# Patient Record
Sex: Female | Born: 1995 | Race: Black or African American | Marital: Single | Smoking: Never smoker
Health system: Southern US, Community
[De-identification: ages and names within clinical notes are randomized; demographics above are authoritative.]

---
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
## 2014-02-10 ENCOUNTER — Ambulatory Visit (INDEPENDENT_AMBULATORY_CARE_PROVIDER_SITE_OTHER): Payer: Self-pay | Admitting: Family Medicine

## 2014-02-10 ENCOUNTER — Encounter: Payer: Self-pay | Admitting: Family Medicine

## 2014-02-10 VITALS — BP 105/71 | HR 79 | Ht 64.0 in | Wt 210.2 lb

## 2014-02-10 DIAGNOSIS — Z025 Encounter for examination for participation in sport: Secondary | ICD-10-CM

## 2014-02-10 DIAGNOSIS — Z0289 Encounter for other administrative examinations: Secondary | ICD-10-CM

## 2014-02-10 NOTE — Assessment & Plan Note (Signed)
Cleared for all sports without restrictions. 

## 2014-02-10 NOTE — Progress Notes (Signed)
Patient ID: Kathryn Obrien, female   DOB: April 01, 1996, 18 y.o.   MRN: [id number]  Patient is a 18 y.o. year old female here for sports physical.  Patient plans to dance.  Reports no current complaints.  Denies chest pain, shortness of breath, passing out with exercise.  No medical problems.  No family history of heart disease or sudden death before age 19.   Vision 20/50 right, 20/50 left - has glasses but not wearing them Blood pressure normal for age and height History of broken forearm when 18 years old, broken left fibula when 89 - no surgeries, no issues currently - healed without a problem.  History reviewed. No pertinent past medical history.  No current outpatient prescriptions on file prior to visit.   No current facility-administered medications on file prior to visit.    History reviewed. No pertinent past surgical history.  No Known Allergies  History   Social History  . Marital Status: Single    Spouse Name: N/A    Number of Children: N/A  . Years of Education: N/A   Occupational History  . Not on file.   Social History Main Topics  . Smoking status: Never Smoker   . Smokeless tobacco: Not on file  . Alcohol Use: Not on file  . Drug Use: Not on file  . Sexual Activity: Not on file   Other Topics Concern  . Not on file   Social History Narrative  . No narrative on file    Family History  Problem Relation Age of Onset  . Sudden death Neg Hx   . Heart attack Neg Hx     BP 105/71  Pulse 79  Ht 5\' 4"  (1.626 m)  Wt 210 lb 3.2 oz (95.346 kg)  BMI 36.06 kg/m2  Review of Systems: See HPI above.  Physical Exam: Gen: NAD CV: RRR no MRG Lungs: CTAB MSK: FROM and strength all joints and muscle groups.  No evidence scoliosis.  Assessment/Plan: 1. Sports physical: Cleared for all sports without restrictions.

## 2020-06-01 DIAGNOSIS — R300 Dysuria: Secondary | ICD-10-CM | POA: Diagnosis not present

## 2020-06-01 DIAGNOSIS — N390 Urinary tract infection, site not specified: Secondary | ICD-10-CM | POA: Diagnosis not present

## 2020-08-18 DIAGNOSIS — Z124 Encounter for screening for malignant neoplasm of cervix: Secondary | ICD-10-CM | POA: Diagnosis not present

## 2020-08-18 DIAGNOSIS — Z113 Encounter for screening for infections with a predominantly sexual mode of transmission: Secondary | ICD-10-CM | POA: Diagnosis not present

## 2020-08-18 DIAGNOSIS — Z01419 Encounter for gynecological examination (general) (routine) without abnormal findings: Secondary | ICD-10-CM | POA: Diagnosis not present

## 2020-09-02 DIAGNOSIS — Z23 Encounter for immunization: Secondary | ICD-10-CM | POA: Diagnosis not present

## 2020-09-02 DIAGNOSIS — R8271 Bacteriuria: Secondary | ICD-10-CM | POA: Diagnosis not present

## 2020-09-02 DIAGNOSIS — Z0001 Encounter for general adult medical examination with abnormal findings: Secondary | ICD-10-CM | POA: Diagnosis not present

## 2020-09-02 DIAGNOSIS — R010 Benign and innocent cardiac murmurs: Secondary | ICD-10-CM | POA: Diagnosis not present

## 2020-09-02 DIAGNOSIS — E669 Obesity, unspecified: Secondary | ICD-10-CM | POA: Diagnosis not present

## 2020-09-02 DIAGNOSIS — Z136 Encounter for screening for cardiovascular disorders: Secondary | ICD-10-CM | POA: Diagnosis not present

## 2020-09-02 DIAGNOSIS — Z124 Encounter for screening for malignant neoplasm of cervix: Secondary | ICD-10-CM | POA: Diagnosis not present

## 2020-09-08 DIAGNOSIS — A749 Chlamydial infection, unspecified: Secondary | ICD-10-CM | POA: Diagnosis not present

## 2020-09-08 DIAGNOSIS — Z113 Encounter for screening for infections with a predominantly sexual mode of transmission: Secondary | ICD-10-CM | POA: Diagnosis not present

## 2020-09-09 DIAGNOSIS — Z1159 Encounter for screening for other viral diseases: Secondary | ICD-10-CM | POA: Diagnosis not present

## 2020-09-09 DIAGNOSIS — R945 Abnormal results of liver function studies: Secondary | ICD-10-CM | POA: Diagnosis not present

## 2020-09-09 DIAGNOSIS — Z136 Encounter for screening for cardiovascular disorders: Secondary | ICD-10-CM | POA: Diagnosis not present

## 2020-09-09 DIAGNOSIS — E669 Obesity, unspecified: Secondary | ICD-10-CM | POA: Diagnosis not present

## 2020-09-12 ENCOUNTER — Other Ambulatory Visit: Payer: Self-pay | Admitting: Internal Medicine

## 2020-09-12 DIAGNOSIS — R945 Abnormal results of liver function studies: Secondary | ICD-10-CM

## 2020-09-23 ENCOUNTER — Ambulatory Visit
Admission: RE | Admit: 2020-09-23 | Discharge: 2020-09-23 | Disposition: A | Discharge status: Home / Self Care | Payer: Federal, State, Local not specified - PPO | Source: Ambulatory Visit | Attending: Internal Medicine | Admitting: Internal Medicine

## 2020-09-23 DIAGNOSIS — R945 Abnormal results of liver function studies: Secondary | ICD-10-CM

## 2020-09-23 DIAGNOSIS — K760 Fatty (change of) liver, not elsewhere classified: Secondary | ICD-10-CM | POA: Diagnosis not present

## 2020-10-07 DIAGNOSIS — A64 Unspecified sexually transmitted disease: Secondary | ICD-10-CM | POA: Diagnosis not present

## 2020-10-07 DIAGNOSIS — Z113 Encounter for screening for infections with a predominantly sexual mode of transmission: Secondary | ICD-10-CM | POA: Diagnosis not present

## 2020-10-07 DIAGNOSIS — Z8619 Personal history of other infectious and parasitic diseases: Secondary | ICD-10-CM | POA: Diagnosis not present

## 2020-10-28 DIAGNOSIS — R945 Abnormal results of liver function studies: Secondary | ICD-10-CM | POA: Diagnosis not present

## 2020-10-28 DIAGNOSIS — Z1159 Encounter for screening for other viral diseases: Secondary | ICD-10-CM | POA: Diagnosis not present

## 2020-11-03 DIAGNOSIS — E669 Obesity, unspecified: Secondary | ICD-10-CM | POA: Diagnosis not present

## 2020-11-03 DIAGNOSIS — E785 Hyperlipidemia, unspecified: Secondary | ICD-10-CM | POA: Diagnosis not present

## 2020-11-03 DIAGNOSIS — R945 Abnormal results of liver function studies: Secondary | ICD-10-CM | POA: Diagnosis not present

## 2020-11-03 DIAGNOSIS — K760 Fatty (change of) liver, not elsewhere classified: Secondary | ICD-10-CM | POA: Diagnosis not present

## 2021-01-11 DIAGNOSIS — Z03818 Encounter for observation for suspected exposure to other biological agents ruled out: Secondary | ICD-10-CM | POA: Diagnosis not present

## 2021-01-11 DIAGNOSIS — Z20822 Contact with and (suspected) exposure to covid-19: Secondary | ICD-10-CM | POA: Diagnosis not present

## 2021-11-15 DIAGNOSIS — Z1159 Encounter for screening for other viral diseases: Secondary | ICD-10-CM | POA: Diagnosis not present

## 2021-11-15 DIAGNOSIS — Z118 Encounter for screening for other infectious and parasitic diseases: Secondary | ICD-10-CM | POA: Diagnosis not present

## 2021-11-15 DIAGNOSIS — Z114 Encounter for screening for human immunodeficiency virus [HIV]: Secondary | ICD-10-CM | POA: Diagnosis not present

## 2021-11-15 DIAGNOSIS — Z01419 Encounter for gynecological examination (general) (routine) without abnormal findings: Secondary | ICD-10-CM | POA: Diagnosis not present

## 2021-11-15 DIAGNOSIS — Z113 Encounter for screening for infections with a predominantly sexual mode of transmission: Secondary | ICD-10-CM | POA: Diagnosis not present

## 2022-08-05 IMAGING — US US ABDOMEN LIMITED RUQ/ASCITES
1 series · 14 of 25 positions shown · non-contrast
Comparison: None.

CLINICAL DATA: Abnormal liver enzymes

EXAM:
ULTRASOUND ABDOMEN LIMITED RIGHT UPPER QUADRANT

[Series 1: us abdomen limited ruq/ascites · 0.28mm/px · 14 of 32 slices shown]
[im 1/32]
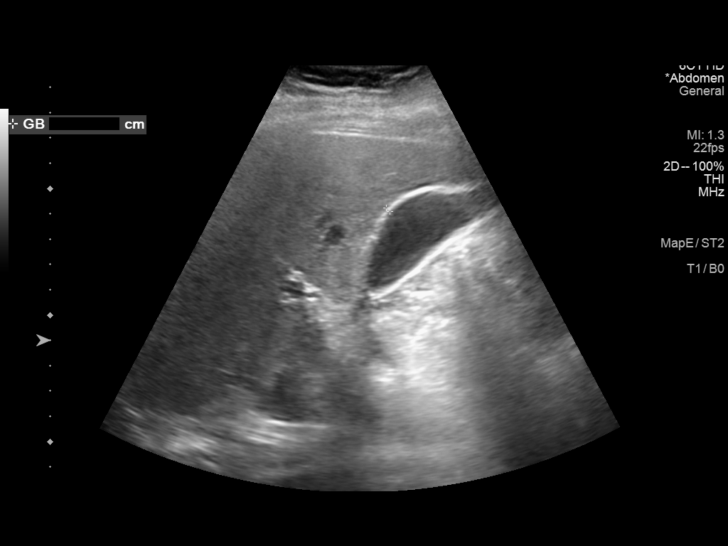
[im 3/32]
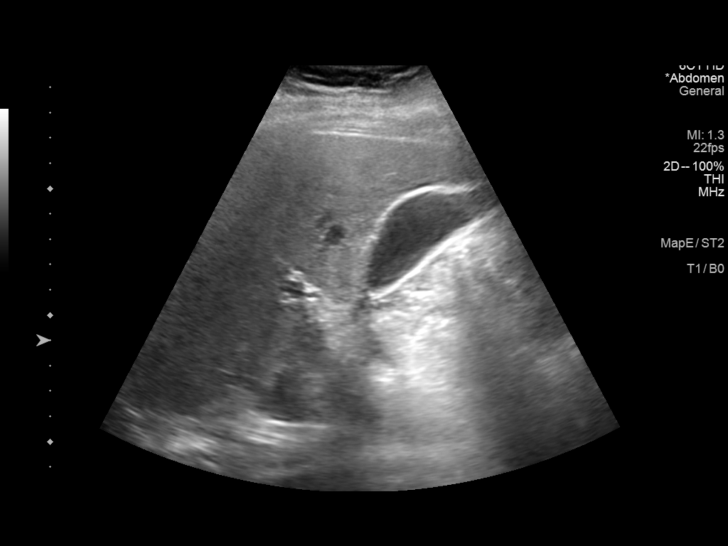
[im 6/32]
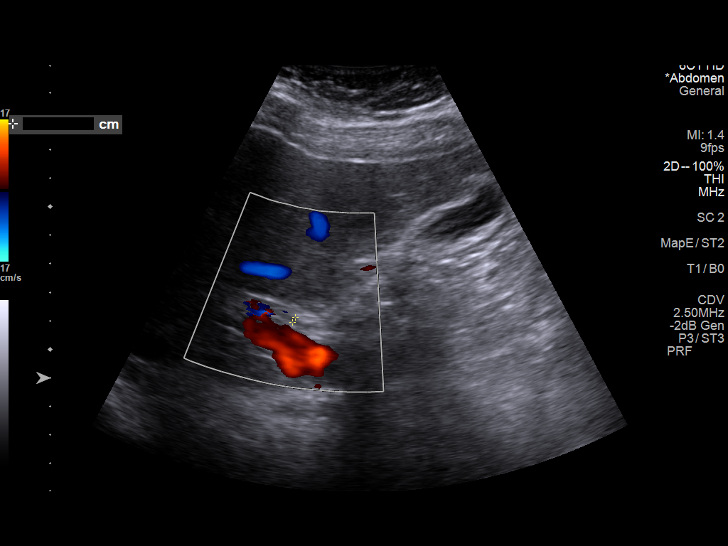
[im 8/32]
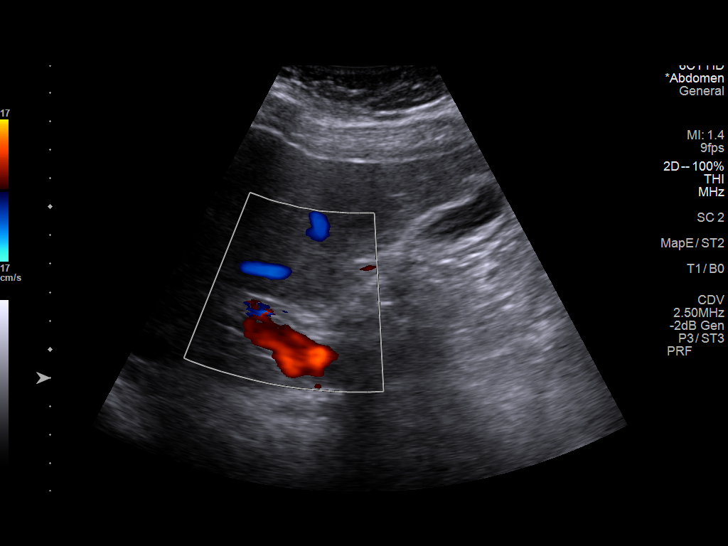
[im 11/32]
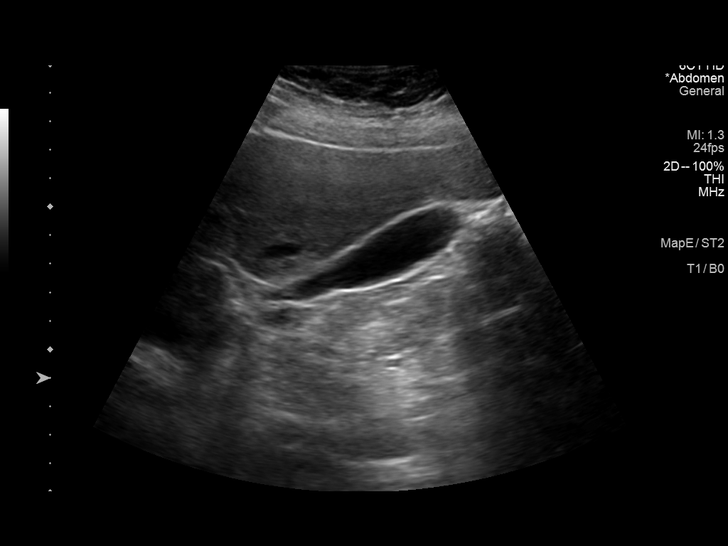
[im 12/32]
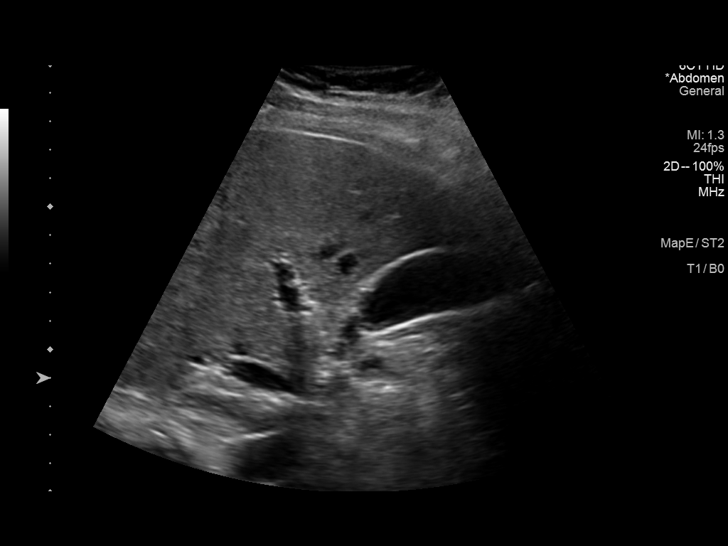
[im 15/32]
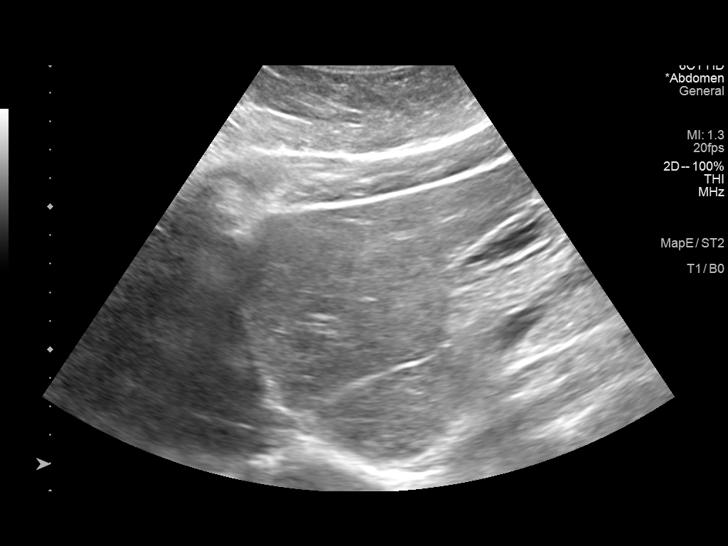
[im 17/32]
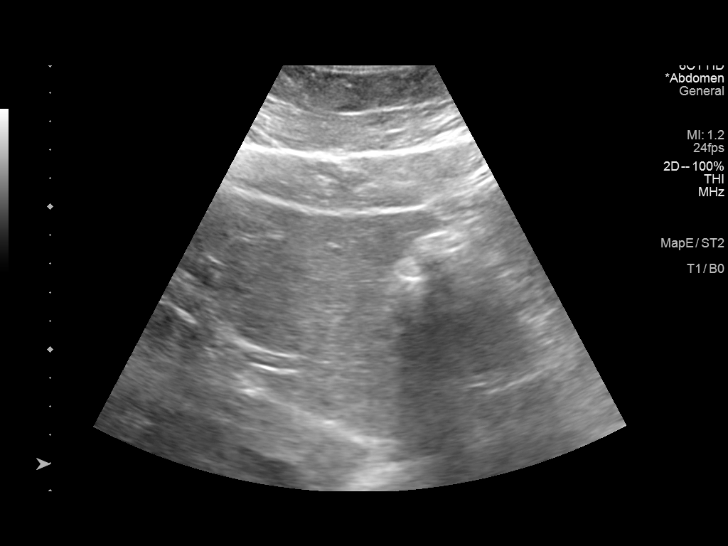
[im 20/32]
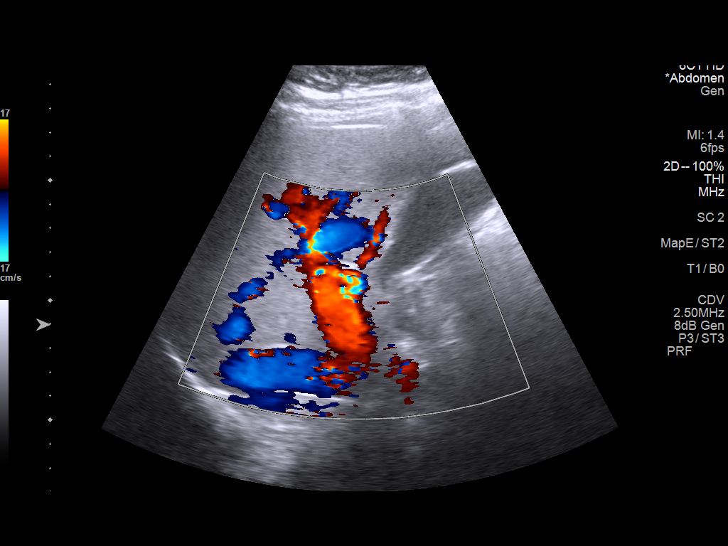
[im 21/32]
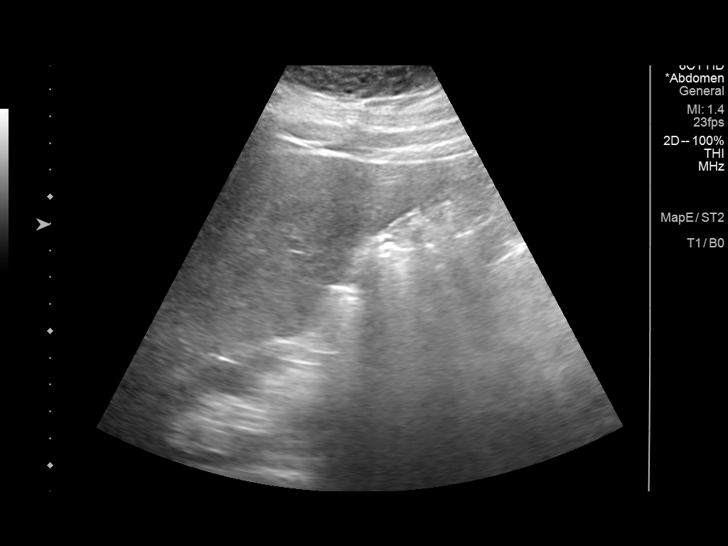
[im 24/32]
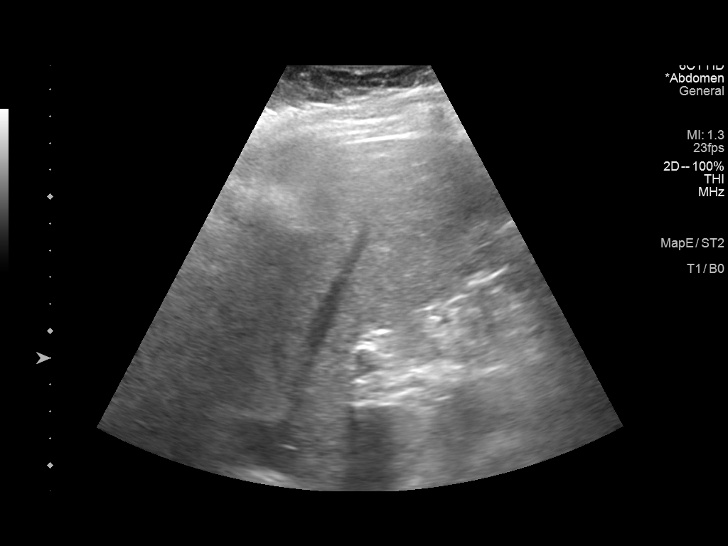
[im 26/32]
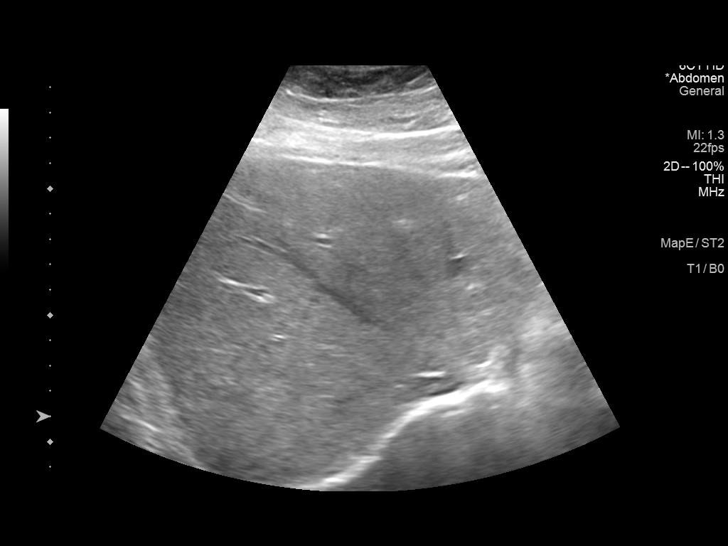
[im 29/32]
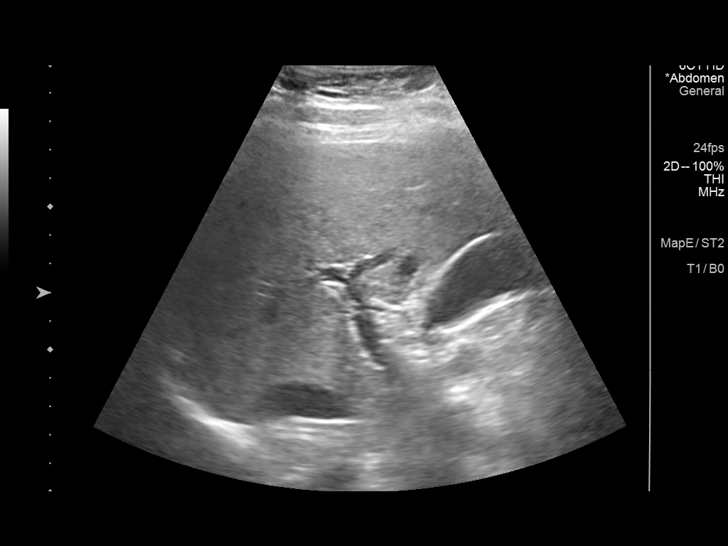
[im 32/32]
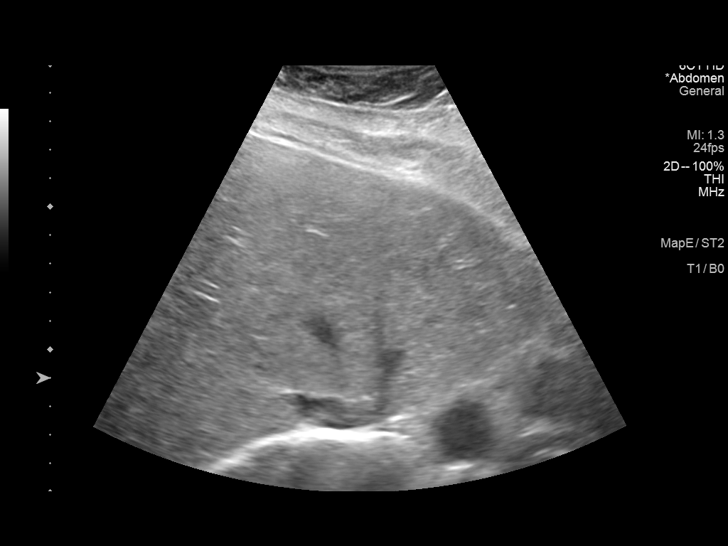

[14 of 25 positions shown; findings below may reference images not displayed]

FINDINGS: Gallbladder:

No gallstones or wall thickening visualized. No sonographic Murphy
sign noted by sonographer.

Common bile duct:

Diameter: 0.2 cm, within normal limits

Liver:

No focal lesion identified. Parenchymal echogenicity is diffusely
increased. Portal vein is patent on color Doppler imaging with
normal direction of blood flow towards the liver.

Other: None.
IMPRESSION: Diffusely increased liver parenchymal echogenicity which is
nonspecific but most commonly seen with hepatic steatosis.

## 2022-09-04 DIAGNOSIS — R300 Dysuria: Secondary | ICD-10-CM | POA: Diagnosis not present

## 2022-09-04 DIAGNOSIS — Z7251 High risk heterosexual behavior: Secondary | ICD-10-CM | POA: Diagnosis not present

## 2022-11-21 DIAGNOSIS — Z01419 Encounter for gynecological examination (general) (routine) without abnormal findings: Secondary | ICD-10-CM | POA: Diagnosis not present

## 2022-11-21 DIAGNOSIS — Z113 Encounter for screening for infections with a predominantly sexual mode of transmission: Secondary | ICD-10-CM | POA: Diagnosis not present

## 2024-08-03 ENCOUNTER — Other Ambulatory Visit: Payer: Self-pay

## 2024-08-03 ENCOUNTER — Encounter (HOSPITAL_BASED_OUTPATIENT_CLINIC_OR_DEPARTMENT_OTHER): Payer: Self-pay

## 2024-08-03 ENCOUNTER — Emergency Department (HOSPITAL_BASED_OUTPATIENT_CLINIC_OR_DEPARTMENT_OTHER)

## 2024-08-03 ENCOUNTER — Emergency Department (HOSPITAL_COMMUNITY)

## 2024-08-03 ENCOUNTER — Observation Stay (HOSPITAL_BASED_OUTPATIENT_CLINIC_OR_DEPARTMENT_OTHER)
Admission: EM | Admit: 2024-08-03 | Discharge: 2024-08-04 | Disposition: A | Discharge status: Home / Self Care | Attending: General Surgery | Admitting: General Surgery

## 2024-08-03 DIAGNOSIS — R1011 Right upper quadrant pain: Secondary | ICD-10-CM | POA: Diagnosis present

## 2024-08-03 DIAGNOSIS — K8012 Calculus of gallbladder with acute and chronic cholecystitis without obstruction: Principal | ICD-10-CM | POA: Insufficient documentation

## 2024-08-03 DIAGNOSIS — K8000 Calculus of gallbladder with acute cholecystitis without obstruction: Secondary | ICD-10-CM | POA: Diagnosis present

## 2024-08-03 LAB — COMPREHENSIVE METABOLIC PANEL WITH GFR
ALT: 22 U/L (ref 0–44)
AST: 17 U/L (ref 15–41)
Albumin: 4.2 g/dL (ref 3.5–5.0)
Alkaline Phosphatase: 97 U/L (ref 38–126)
Anion gap: 11 (ref 5–15)
BUN: 8 mg/dL (ref 6–20)
CO2: 23 mmol/L (ref 22–32)
Calcium: 9.8 mg/dL (ref 8.9–10.3)
Chloride: 103 mmol/L (ref 98–111)
Creatinine, Ser: 0.97 mg/dL (ref 0.44–1.00)
GFR, Estimated: >60 mL/min
Glucose, Bld: 107 mg/dL — ABNORMAL HIGH (ref 70–99)
Potassium: 3.8 mmol/L (ref 3.5–5.1)
Sodium: 137 mmol/L (ref 135–145)
Total Bilirubin: 0.5 mg/dL (ref 0.0–1.2)
Total Protein: 7.5 g/dL (ref 6.5–8.1)

## 2024-08-03 LAB — CBC
HCT: 40.8 % (ref 36.0–46.0)
Hemoglobin: 13.3 g/dL (ref 12.0–15.0)
MCH: 27 pg (ref 26.0–34.0)
MCHC: 32.6 g/dL (ref 30.0–36.0)
MCV: 82.9 fL (ref 80.0–100.0)
Platelets: 369 10*3/uL (ref 150–400)
RBC: 4.92 MIL/uL (ref 3.87–5.11)
RDW: 14.6 % (ref 11.5–15.5)
WBC: 12 10*3/uL — ABNORMAL HIGH (ref 4.0–10.5)
nRBC: 0 % (ref 0.0–0.2)

## 2024-08-03 LAB — URINALYSIS, ROUTINE W REFLEX MICROSCOPIC
Glucose, UA: NEGATIVE mg/dL
Ketones, ur: NEGATIVE mg/dL
Nitrite: NEGATIVE
Protein, ur: 100 mg/dL — AB
Specific Gravity, Urine: 1.025 (ref 1.005–1.030)
pH: 5.5 (ref 5.0–8.0)

## 2024-08-03 LAB — URINALYSIS, MICROSCOPIC (REFLEX)

## 2024-08-03 LAB — PREGNANCY, URINE: Preg Test, Ur: NEGATIVE

## 2024-08-03 LAB — LIPASE, BLOOD: Lipase: 30 U/L (ref 11–51)

## 2024-08-03 MED ORDER — IOHEXOL 300 MG/ML  SOLN
75.0000 mL | Freq: Once | INTRAMUSCULAR | Status: AC | PRN
  Administered 2024-08-03: 75 mL via INTRAVENOUS

## 2024-08-03 MED ORDER — SODIUM CHLORIDE 0.9 % IV SOLN
2.0000 g | Freq: Once | INTRAVENOUS | Status: AC
  Administered 2024-08-03: 2 g via INTRAVENOUS
  Filled 2024-08-03: qty 20

## 2024-08-03 MED ORDER — KETOROLAC TROMETHAMINE 15 MG/ML IJ SOLN
15.0000 mg | Freq: Once | INTRAMUSCULAR | Status: AC
  Administered 2024-08-03: 15 mg via INTRAVENOUS
  Filled 2024-08-03: qty 1

## 2024-08-03 NOTE — ED Provider Notes (Signed)
 Clinical Course as of 08/03/24 2223  Mon Aug 03, 2024  2019 Patient transferred from freestanding ED for ultrasound to evaluate for cholecystitis.  Ultrasound is concerning for calculous cholecystitis.  Does have white count of 12.  Antibiotics ordered.  Case discussed with Dr. Aron again who will evaluate for surgical intervention. [TY]    Clinical Course User Index [TY] Neysa Caron PARAS, DO      Neysa Caron PARAS, [place] 08/03/24 2223

## 2024-08-03 NOTE — ED Provider Notes (Signed)
 " [place] [facility] Provider Note   CSN: [id number] Arrival date & time: 08/03/24  1252     Patient presents with: Abdominal Pain   Kathryn Obrien is a 29 y.o. female.   Patient with no pertinent past medical history presents today with complaints of abdominal pain. Reports that pain is in her mid abdomen and has been ongoing for the past 3 days. Reports that she has decreased appetite with associated nausea without vomiting. Reports that food seems to make her pain worse and therefore she has only eaten a small bag of chips in the last 24 hours. Denies history of similar pain previously, no history of abdominal surgeries. Does report that her mother had issues with her gallbladder when she was her age. No fevers or chills. Denies urinary symptoms or vaginal discharge. LMP was 12/31 and was normal for her.    The history is provided by the patient. No language interpreter was used.  Abdominal Pain Associated symptoms: nausea        Prior to Admission medications  Not on File    Allergies: Patient has no known allergies.    Review of Systems  Gastrointestinal:  Positive for abdominal pain and nausea.  All other systems reviewed and are negative.   Updated Vital Signs BP 126/85   Pulse 78   Temp 99.1 F (37.3 C)   Resp 16   SpO2 99%   Physical Exam Vitals and nursing note reviewed.  Constitutional:      General: She is not in acute distress.    Appearance: Normal appearance. She is normal weight. She is not ill-appearing, toxic-appearing or diaphoretic.  HENT:     Head: Normocephalic and atraumatic.  Cardiovascular:     Rate and Rhythm: Normal rate.  Pulmonary:     Effort: Pulmonary effort is normal. No respiratory distress.  Abdominal:     General: Abdomen is flat.     Palpations: Abdomen is soft.     Tenderness: There is abdominal tenderness in the right upper quadrant and epigastric area. Positive signs include Murphy's  sign.  Musculoskeletal:        General: Normal range of motion.     Cervical back: Normal range of motion.  Skin:    General: Skin is warm and dry.  Neurological:     General: No focal deficit present.     Mental Status: She is alert.  Psychiatric:        Mood and Affect: Mood normal.        Behavior: Behavior normal.     (all labs ordered are listed, but only abnormal results are displayed) Labs Reviewed  COMPREHENSIVE METABOLIC PANEL WITH GFR - Abnormal; Notable for the following components:      Result Value   Glucose, Bld 107 (*)    All other components within normal limits  CBC - Abnormal; Notable for the following components:   WBC 12.0 (*)    All other components within normal limits  URINALYSIS, ROUTINE W REFLEX MICROSCOPIC - Abnormal; Notable for the following components:   APPearance TURBID (*)    Hgb urine dipstick TRACE (*)    Bilirubin Urine SMALL (*)    Protein, ur 100 (*)    Leukocytes,Ua SMALL (*)    All other components within normal limits  URINALYSIS, MICROSCOPIC (REFLEX) - Abnormal; Notable for the following components:   Bacteria, UA FEW (*)    All other components within normal limits  LIPASE,  BLOOD  PREGNANCY, URINE    EKG: None  Radiology: No results found.   Procedures   Medications Ordered in the ED  iohexol  (OMNIPAQUE ) 300 MG/ML solution 75 mL (has no administration in time range)  ketorolac  (TORADOL ) 15 MG/ML injection 15 mg (15 mg Intravenous Given 08/03/24 1700)                                    Medical Decision Making Amount and/or Complexity of Data Reviewed Labs: ordered. Radiology: ordered.  Risk Prescription drug management.   This patient is a 29 y.o. female who presents to the ED for concern of RUQ and epigastric abdominal TTP, this involves an extensive number of treatment options, and is a complaint that carries with it a high risk of complications and morbidity. The emergent differential diagnosis prior to  evaluation includes, but is not limited to,  Gallbladder disease, Acute Hepatitis, PUD, GERD, gastritis, pancreatitis, gastroparesis, malignancy, biliary disease, ACS, pericarditis, pneumonia, Lower lobe PE/Infarct, pregnancy, retrocecal appendicitis, Fitz-Hugh-Curtis syndrome  This is not an exhaustive differential.   Past Medical History / Co-morbidities / Social History:  has no past medical history on file.  Additional history: Chart reviewed.  Physical Exam: Physical exam performed. The pertinent findings include: RUQ TTP, + Murphy sign  Lab Tests: I ordered, and personally interpreted labs.  The pertinent results include:  WBC 12, normal liver enzymes and lipase, UA with billi, protein, leukocytes, WBCs, few bacteria, however does have 11-20 squamous cells, potentially contaminant given no symptoms.   Imaging Studies: I ordered imaging studies including CT abdomen pelvis. I independently visualized and interpreted imaging which showed   Gallbladder with multiple gallstones and wall thickening, suspicious for cholecystitis. Ultrasound may be helpful for further evaluation.  I agree with the radiologist interpretation.   Medications: I ordered medication including toradol   for pain. Reevaluation of the patient after these medicines showed that the patient stayed the same.   However, patient drove here and does not have a ride and therefore does not want narcotic pain medications  Consultations Obtained: I requested consultation with the general surgery on call Dr. Aron,  and discussed lab and imaging findings as well as pertinent plan - they recommend: transfer to [facility] emergency department for RUQ US  given CT findings. Surgical consult based on US  results  Disposition: After consideration of the diagnostic results and the patients response to treatment, I feel that patient will require ED to ED transfer to [facility] Emergency department for RUQ US /surgical consult.  Discussed same with patient who would prefer to drive herself POV as she does not have a ride or someone who could take her back to her car if she leaves it here.   Discussed patient with EDP Dr. Lenor who accepts patient for transfer.    I discussed this case with my attending physician Dr. Ula who cosigned this note including patient's presenting symptoms, physical exam, and planned diagnostics and interventions. Attending physician stated agreement with plan or made changes to plan which were implemented.    Final diagnoses:  Right upper quadrant pain    ED Discharge Orders     None          Nora Lauraine DELENA DEVONNA 08/03/24 1832    Ula Prentice SAUNDERS, MD 08/03/24 2249  "

## 2024-08-03 NOTE — H&P (Signed)
 Kathryn Obrien is an 29 y.o. female.   Chief Complaint: Abdominal pain HPI:  Pt presents with 3 days of worsening abdominal pain.  The pain started on Saturday and was constant but had periods of worsening when she would tried to eat.  Since the pain was unremitting, she came to the emergency department when the roads were improved.  She has had similar symptoms in the past but previously the pain would always go away within a couple of hours.  Patient's mother also has had cholecystitis and required a cholecystectomy within the past 3 months.  Patient works at a call center.  History reviewed. No pertinent past medical history.  History reviewed. No pertinent surgical history.  Family History  Problem Relation Age of Onset   Sudden death Neg Hx    Heart attack Neg Hx    Social History:  reports that she has never smoked. She does not have any smokeless tobacco history on file. No history on file for alcohol use and drug use.  Allergies: Allergies[1]  Meds: none  Results for orders placed or performed during the hospital encounter of 08/03/24 (from the past 48 hours)  Lipase, blood     Status: None   Collection Time: 08/03/24  1:02 PM  Result Value Ref Range   Lipase 30 11 - 51 U/L    Comment: Performed at [facility], [address] [place], [place] [address]  Comprehensive metabolic panel     Status: Abnormal   Collection Time: 08/03/24  1:02 PM  Result Value Ref Range   Sodium 137 135 - 145 mmol/L   Potassium 3.8 3.5 - 5.1 mmol/L   Chloride 103 98 - 111 mmol/L   CO2 23 22 - 32 mmol/L   Glucose, Bld 107 (H) 70 - 99 mg/dL    Comment: Glucose reference range applies only to samples taken after fasting for at least 8 hours.   BUN 8 6 - 20 mg/dL   Creatinine, Ser 9.02 0.44 - 1.00 mg/dL   Calcium 9.8 8.9 - 89.6 mg/dL   Total Protein 7.5 6.5 - 8.1 g/dL   Albumin 4.2 3.5 - 5.0 g/dL   AST 17 15 - 41 U/L   ALT 22 0 - 44 U/L   Alkaline Phosphatase 97 38 - 126 U/L    Total Bilirubin 0.5 0.0 - 1.2 mg/dL   GFR, Estimated >39 >39 mL/min    Comment: (NOTE) Calculated using the CKD-EPI Creatinine Equation (2021)    Anion gap 11 5 - 15    Comment: Performed at [facility], [address] [place], [place] [address]  CBC     Status: Abnormal   Collection Time: 08/03/24  1:02 PM  Result Value Ref Range   WBC 12.0 (H) 4.0 - 10.5 K/uL   RBC 4.92 3.87 - 5.11 MIL/uL   Hemoglobin 13.3 12.0 - 15.0 g/dL   HCT 59.1 63.9 - 53.9 %   MCV 82.9 80.0 - 100.0 fL   MCH 27.0 26.0 - 34.0 pg   MCHC 32.6 30.0 - 36.0 g/dL   RDW 85.3 88.4 - 84.4 %   Platelets 369 150 - 400 K/uL   nRBC 0.0 0.0 - 0.2 %    Comment: Performed at [facility], [address] [place], [place] [address]  Urinalysis, Routine w reflex microscopic -Urine, Clean Catch     Status: Abnormal   Collection Time: 08/03/24  2:59 PM  Result Value  Ref Range   Color, Urine YELLOW YELLOW    Comment: AMBER   APPearance TURBID (A) CLEAR   Specific Gravity, Urine 1.025 1.005 - 1.030   pH 5.5 5.0 - 8.0   Glucose, UA NEGATIVE NEGATIVE mg/dL   Hgb urine dipstick TRACE (A) NEGATIVE   Bilirubin Urine SMALL (A) NEGATIVE   Ketones, ur NEGATIVE NEGATIVE mg/dL   Protein, ur 899 (A) NEGATIVE mg/dL   Nitrite NEGATIVE NEGATIVE   Leukocytes,Ua SMALL (A) NEGATIVE    Comment: Performed at [facility], [address] [place], [place] [address]  Pregnancy, urine     Status: None   Collection Time: 08/03/24  2:59 PM  Result Value Ref Range   Preg Test, Ur NEGATIVE NEGATIVE    Comment:        THE SENSITIVITY OF THIS METHODOLOGY IS >20 mIU/mL. Performed at [facility], [address] [place], [place] [address]   Urinalysis, Microscopic (reflex)     Status: Abnormal   Collection Time: 08/03/24  2:59 PM  Result Value Ref Range   RBC / HPF 0-5 0 - 5 RBC/hpf   WBC, UA 21-50 0 - 5 WBC/hpf   Bacteria, UA FEW (A) NONE SEEN   Squamous Epithelial / HPF 11-20 0 - 5 /HPF   Mucus  PRESENT     Comment: Performed at [facility], [address] [place], [place] [address]   US  Abdomen Limited RUQ (LIVER/GB) Result Date: 08/03/2024 EXAM: Right Upper Quadrant Abdominal Ultrasound 08/03/2024 07:51:19 PM TECHNIQUE: Real-time ultrasonography of the right upper quadrant of the abdomen was performed. COMPARISON: None available. CLINICAL HISTORY: RUQ abdominal pain. FINDINGS: LIVER: Normal echogenicity. No intrahepatic biliary ductal dilatation. No evidence of mass. Hepatopetal flow in the portal vein. BILIARY SYSTEM: The gallbladder is distended and contains multiple mobile gallstones measuring up to 2 cm as well as a 17 mm gallstone impacted within the gallbladder neck. There is gallbladder wall thickening with the gallbladder wall measuring up to 4 mm in thickness as well as trace pericholecystic fluid identified. Altogether, the findings were in keeping with acute cholecystitis. The sonographic Beverley sign is reportedly negative, equivocal given the appearance of the gallbladder described above. Common bile duct measures 3 mm in proximal diameter. RIGHT KIDNEY: Limited images of the right kidney are unremarkable. No hydronephrosis. PANCREAS: Pancreas is obscured by overlying bowel gas. OTHER: No right upper quadrant ascites. IMPRESSION: 1. Acute calculus cholecystitis with 17 mm gallstone impacted in the gallbladder neck. Electronically signed by: Dorethia Molt MD 08/03/2024 07:58 PM EST RP Workstation: [id number]   CT ABDOMEN PELVIS W CONTRAST Result Date: 08/03/2024 EXAM: CT ABDOMEN AND PELVIS WITH CONTRAST 08/03/2024 05:27:21 PM TECHNIQUE: CT of the abdomen and pelvis was performed with the administration of 75 mL of iohexol  (OMNIPAQUE ) 300 MG/ML solution. Multiplanar reformatted images are provided for review. Automated exposure control, iterative reconstruction, and/or weight-based adjustment of the mA/kV was utilized to reduce the radiation dose to as low as reasonably  achievable. COMPARISON: None available. CLINICAL HISTORY: Acute abdominal pain for 2 days. FINDINGS: LOWER CHEST: Lung bases show minimal basilar atelectasis. LIVER: The liver is unremarkable. GALLBLADDER AND BILE DUCTS: The gallbladder is well distended with multiple gallstones. Some wall thickening is noted which is suspicious for cholecystitis. Ultrasound may be helpful for further evaluation. No biliary ductal dilatation. SPLEEN: No acute abnormality. PANCREAS: No acute abnormality. ADRENAL GLANDS: No acute abnormality. KIDNEYS, URETERS AND BLADDER: No renal calculi or obstructive changes  are seen. No hydronephrosis. No perinephric or periureteral stranding. The bladder is decompressed. GI AND BOWEL: Stomach demonstrates no acute abnormality. No obstructive or inflammatory changes of the colon are noted. There is no bowel obstruction. PERITONEUM AND RETROPERITONEUM: No ascites. No free air. VASCULATURE: Aorta is normal in caliber. LYMPH NODES: No lymphadenopathy. REPRODUCTIVE ORGANS: No acute abnormality. BONES AND SOFT TISSUES: No acute osseous abnormality. No focal soft tissue abnormality. IMPRESSION: 1. Gallbladder with multiple gallstones and wall thickening, suspicious for cholecystitis. Ultrasound may be helpful for further evaluation. Electronically signed by: Oneil Devonshire MD 08/03/2024 05:33 PM EST RP Workstation: [id number]    Review of Systems  Gastrointestinal:  Positive for abdominal pain and nausea.  All other systems reviewed and are negative.   Blood pressure 125/85, pulse 80, temperature 98.8 F (37.1 C), temperature source Oral, resp. rate 16, SpO2 100%. Physical Exam Vitals reviewed.  Constitutional:      General: Distressed: looks uncomfortable.     Appearance: She is well-developed.  HENT:     Head: Normocephalic and atraumatic.     Mouth/Throat:     Mouth: Mucous membranes are moist.  Cardiovascular:     Rate and Rhythm: Normal rate and regular rhythm.  Pulmonary:      Effort: Pulmonary effort is normal. No respiratory distress.     Breath sounds: No stridor.  Abdominal:     Palpations: Abdomen is soft. There is no shifting dullness, fluid wave, hepatomegaly or splenomegaly.     Tenderness: There is abdominal tenderness in the right upper quadrant. There is no guarding or rebound. Positive signs include Murphy's sign. Negative signs include Rovsing's sign.     Hernia: No hernia is present.  Skin:    General: Skin is warm and dry.     Capillary Refill: Capillary refill takes 2 to 3 seconds.     Coloration: Skin is not mottled or pale.  Neurological:     General: No focal deficit present.     Mental Status: She is alert and oriented to person, place, and time.     Cranial Nerves: No cranial nerve deficit.     Motor: No weakness.  Psychiatric:        Mood and Affect: Mood normal. Mood is not anxious or depressed.        Behavior: Behavior normal.      Assessment/Plan Acute calculous cholecystitis  NPO after 2 am IV fluids Pain control Lap chole tentatively planned for tomorrow.    The surgical procedure was described to the patient in detail.   I discussed the incision type and location, the location of the gallbladder, the anatomy of the bile ducts and arteries, and the typical progression of surgery.  I discussed the possibility of converting to an open operation.  I advised of the risks of bleeding, infection, damage to other structures (such as the bile duct, intestine or liver), bile leak, need for other procedures or surgeries, and post op diarrhea/constipation.  We discussed the risk of blood clot.  We discussed the recovery period and post operative restrictions.         Jina LITTIE Nephew, MD, FACS, FSSO Surgical Oncology, General Surgery, Trauma and Critical [facility], [place] [phone] for weekday/non holidays Check [email or web address] for coverage night/weekend/holidays under General Surgery    08/03/2024, 9:16 PM         [1] No Known Allergies

## 2024-08-03 NOTE — ED Notes (Signed)
 IV secured pt understands to go straigt to [facility]  ER with any stops, pt given papers to give to charge that I gave report to pt [id number] was told to drive carefully

## 2024-08-03 NOTE — ED Notes (Signed)
 Hasn't had a bowl movement since sat pm

## 2024-08-03 NOTE — ED Triage Notes (Signed)
 Mid abd pain for 3 days. Nausea.  Denies emesis, diarrhea, urinary symptoms

## 2024-08-03 NOTE — Discharge Instructions (Signed)
 You need to go to [facility] long emergency department at the address above. Do not eat or drink anything

## 2024-08-04 ENCOUNTER — Observation Stay (HOSPITAL_COMMUNITY): Admitting: Certified Registered"

## 2024-08-04 ENCOUNTER — Encounter (HOSPITAL_COMMUNITY): Admission: EM | Disposition: A | Discharge status: Home / Self Care | Payer: Self-pay | Source: Home / Self Care

## 2024-08-04 ENCOUNTER — Other Ambulatory Visit (HOSPITAL_COMMUNITY): Payer: Self-pay

## 2024-08-04 ENCOUNTER — Encounter (HOSPITAL_COMMUNITY): Payer: Self-pay

## 2024-08-04 LAB — CBC
HCT: 37.9 % (ref 36.0–46.0)
Hemoglobin: 12.6 g/dL (ref 12.0–15.0)
MCH: 27.7 pg (ref 26.0–34.0)
MCHC: 33.2 g/dL (ref 30.0–36.0)
MCV: 83.3 fL (ref 80.0–100.0)
Platelets: 318 10*3/uL (ref 150–400)
RBC: 4.55 MIL/uL (ref 3.87–5.11)
RDW: 14.6 % (ref 11.5–15.5)
WBC: 10.6 10*3/uL — ABNORMAL HIGH (ref 4.0–10.5)
nRBC: 0 % (ref 0.0–0.2)

## 2024-08-04 LAB — COMPREHENSIVE METABOLIC PANEL WITH GFR
ALT: 20 U/L (ref 0–44)
AST: 17 U/L (ref 15–41)
Albumin: 3.9 g/dL (ref 3.5–5.0)
Alkaline Phosphatase: 102 U/L (ref 38–126)
Anion gap: 11 (ref 5–15)
BUN: 9 mg/dL (ref 6–20)
CO2: 22 mmol/L (ref 22–32)
Calcium: 9.9 mg/dL (ref 8.9–10.3)
Chloride: 101 mmol/L (ref 98–111)
Creatinine, Ser: 0.92 mg/dL (ref 0.44–1.00)
GFR, Estimated: >60 mL/min
Glucose, Bld: 106 mg/dL — ABNORMAL HIGH (ref 70–99)
Potassium: 3.5 mmol/L (ref 3.5–5.1)
Sodium: 134 mmol/L — ABNORMAL LOW (ref 135–145)
Total Bilirubin: 0.4 mg/dL (ref 0.0–1.2)
Total Protein: 7.1 g/dL (ref 6.5–8.1)

## 2024-08-04 MED ORDER — OXYCODONE HCL 5 MG/5ML PO SOLN: 5.0000 mg | Freq: Once | ORAL | Status: AC | PRN

## 2024-08-04 MED ORDER — KETOROLAC TROMETHAMINE 30 MG/ML IJ SOLN
INTRAMUSCULAR | Status: DC | PRN
  Administered 2024-08-04: 30 mg via INTRAVENOUS

## 2024-08-04 MED ORDER — ONDANSETRON HCL 4 MG/2ML IJ SOLN
INTRAMUSCULAR | Status: DC | PRN
  Administered 2024-08-04: 4 mg via INTRAVENOUS

## 2024-08-04 MED ORDER — ONDANSETRON HCL 4 MG/2ML IJ SOLN: 4.0000 mg | Freq: Four times a day (QID) | INTRAMUSCULAR | Status: DC | PRN

## 2024-08-04 MED ORDER — OXYCODONE HCL 5 MG PO TABS
5.0000 mg | ORAL_TABLET | Freq: Once | ORAL | Status: AC | PRN
  Administered 2024-08-04: 5 mg via ORAL

## 2024-08-04 MED ORDER — ENOXAPARIN SODIUM 40 MG/0.4ML IJ SOSY: 40.0000 mg | PREFILLED_SYRINGE | INTRAMUSCULAR | Status: DC

## 2024-08-04 MED ORDER — KCL-LACTATED RINGERS-D5W 20 MEQ/L IV SOLN
INTRAVENOUS | Status: DC
  Filled 2024-08-04 (×3): qty 1000

## 2024-08-04 MED ORDER — FENTANYL CITRATE (PF) 100 MCG/2ML IJ SOLN
INTRAMUSCULAR | Status: AC
  Filled 2024-08-04: qty 2

## 2024-08-04 MED ORDER — METHOCARBAMOL 500 MG PO TABS: 500.0000 mg | ORAL_TABLET | Freq: Three times a day (TID) | ORAL | Status: DC | PRN

## 2024-08-04 MED ORDER — FENTANYL CITRATE (PF) 50 MCG/ML IJ SOSY
PREFILLED_SYRINGE | INTRAMUSCULAR | Status: AC
  Filled 2024-08-04: qty 1

## 2024-08-04 MED ORDER — DEXAMETHASONE SOD PHOSPHATE PF 10 MG/ML IJ SOLN
INTRAMUSCULAR | Status: AC
  Filled 2024-08-04: qty 1

## 2024-08-04 MED ORDER — SUGAMMADEX SODIUM 200 MG/2ML IV SOLN
INTRAVENOUS | Status: DC | PRN
  Administered 2024-08-04: 100 mg via INTRAVENOUS
  Administered 2024-08-04: 200 mg via INTRAVENOUS

## 2024-08-04 MED ORDER — MIDAZOLAM HCL 2 MG/2ML IJ SOLN
INTRAMUSCULAR | Status: AC
  Filled 2024-08-04: qty 2

## 2024-08-04 MED ORDER — ONDANSETRON 4 MG PO TBDP: 4.0000 mg | ORAL_TABLET | Freq: Four times a day (QID) | ORAL | Status: DC | PRN

## 2024-08-04 MED ORDER — NORGESTREL-ETHINYL ESTRADIOL 0.3-30 MG-MCG PO TABS: 1.0000 | ORAL_TABLET | Freq: Every day | ORAL | Status: DC

## 2024-08-04 MED ORDER — MIDAZOLAM HCL (PF) 2 MG/2ML IJ SOLN
INTRAMUSCULAR | Status: DC | PRN
  Administered 2024-08-04: 2 mg via INTRAVENOUS

## 2024-08-04 MED ORDER — MELATONIN 3 MG PO TABS: 3.0000 mg | ORAL_TABLET | Freq: Every evening | ORAL | Status: DC | PRN

## 2024-08-04 MED ORDER — SENNA 8.6 MG PO TABS
1.0000 | ORAL_TABLET | Freq: Two times a day (BID) | ORAL | Status: DC
  Administered 2024-08-04: 8.6 mg via ORAL
  Filled 2024-08-04: qty 1

## 2024-08-04 MED ORDER — BUPIVACAINE-EPINEPHRINE 0.25% -1:200000 IJ SOLN
INTRAMUSCULAR | Status: DC | PRN
  Administered 2024-08-04: 30 mL

## 2024-08-04 MED ORDER — 0.9 % SODIUM CHLORIDE (POUR BTL) OPTIME
TOPICAL | Status: DC | PRN
  Administered 2024-08-04: 1000 mL

## 2024-08-04 MED ORDER — OXYCODONE HCL 5 MG PO TABS
5.0000 mg | ORAL_TABLET | Freq: Four times a day (QID) | ORAL | 0 refills | Status: AC | PRN
  Filled 2024-08-04: qty 15, 4d supply, fill #0

## 2024-08-04 MED ORDER — KETOROLAC TROMETHAMINE 30 MG/ML IJ SOLN: 30.0000 mg | Freq: Four times a day (QID) | INTRAMUSCULAR | Status: DC | PRN

## 2024-08-04 MED ORDER — METHOCARBAMOL 1000 MG/10ML IJ SOLN: 500.0000 mg | Freq: Three times a day (TID) | INTRAMUSCULAR | Status: DC | PRN

## 2024-08-04 MED ORDER — ROCURONIUM BROMIDE 10 MG/ML (PF) SYRINGE
PREFILLED_SYRINGE | INTRAVENOUS | Status: DC | PRN
  Administered 2024-08-04: 10 mg via INTRAVENOUS
  Administered 2024-08-04: 50 mg via INTRAVENOUS

## 2024-08-04 MED ORDER — FENTANYL CITRATE (PF) 100 MCG/2ML IJ SOLN
INTRAMUSCULAR | Status: DC | PRN
  Administered 2024-08-04 (×4): 50 ug via INTRAVENOUS

## 2024-08-04 MED ORDER — CHLORHEXIDINE GLUCONATE 0.12 % MT SOLN: 15.0000 mL | Freq: Once | OROMUCOSAL | Status: DC

## 2024-08-04 MED ORDER — PROPOFOL 10 MG/ML IV BOLUS
INTRAVENOUS | Status: DC | PRN
  Administered 2024-08-04: 200 mg via INTRAVENOUS

## 2024-08-04 MED ORDER — DIPHENHYDRAMINE HCL 12.5 MG/5ML PO ELIX: 12.5000 mg | ORAL_SOLUTION | Freq: Four times a day (QID) | ORAL | Status: DC | PRN

## 2024-08-04 MED ORDER — ONDANSETRON HCL 4 MG/2ML IJ SOLN
INTRAMUSCULAR | Status: AC
  Filled 2024-08-04: qty 2

## 2024-08-04 MED ORDER — FENTANYL CITRATE (PF) 50 MCG/ML IJ SOSY
25.0000 ug | PREFILLED_SYRINGE | INTRAMUSCULAR | Status: DC | PRN
  Administered 2024-08-04 (×2): 50 ug via INTRAVENOUS

## 2024-08-04 MED ORDER — LACTATED RINGERS IV SOLN: INTRAVENOUS | Status: DC

## 2024-08-04 MED ORDER — IBUPROFEN 800 MG PO TABS
800.0000 mg | ORAL_TABLET | Freq: Three times a day (TID) | ORAL | 0 refills | Status: AC | PRN
  Filled 2024-08-04: qty 30, 10d supply, fill #0

## 2024-08-04 MED ORDER — INDOCYANINE GREEN 25 MG IJ SOLR
1.2500 mg | Freq: Once | INTRAMUSCULAR | Status: AC
  Administered 2024-08-04: 1.25 mg via INTRAVENOUS
  Filled 2024-08-04: qty 10

## 2024-08-04 MED ORDER — OXYCODONE HCL 5 MG PO TABS
ORAL_TABLET | ORAL | Status: AC
  Filled 2024-08-04: qty 1

## 2024-08-04 MED ORDER — DEXAMETHASONE SOD PHOSPHATE PF 10 MG/ML IJ SOLN
INTRAMUSCULAR | Status: DC | PRN
  Administered 2024-08-04: 4 mg via INTRAVENOUS

## 2024-08-04 MED ORDER — PROPOFOL 10 MG/ML IV BOLUS
INTRAVENOUS | Status: AC
  Filled 2024-08-04: qty 20

## 2024-08-04 MED ORDER — HYDROMORPHONE HCL 1 MG/ML IJ SOLN: 0.5000 mg | INTRAMUSCULAR | Status: DC | PRN

## 2024-08-04 MED ORDER — BUPIVACAINE-EPINEPHRINE (PF) 0.25% -1:200000 IJ SOLN
INTRAMUSCULAR | Status: AC
  Filled 2024-08-04: qty 30

## 2024-08-04 MED ORDER — PROCHLORPERAZINE MALEATE 10 MG PO TABS: 10.0000 mg | ORAL_TABLET | Freq: Four times a day (QID) | ORAL | Status: DC | PRN

## 2024-08-04 MED ORDER — LIDOCAINE HCL (PF) 2 % IJ SOLN
INTRAMUSCULAR | Status: AC
  Filled 2024-08-04: qty 5

## 2024-08-04 MED ORDER — SUGAMMADEX SODIUM 200 MG/2ML IV SOLN
INTRAVENOUS | Status: AC
  Filled 2024-08-04: qty 4

## 2024-08-04 MED ORDER — ROCURONIUM BROMIDE 10 MG/ML (PF) SYRINGE
PREFILLED_SYRINGE | INTRAVENOUS | Status: AC
  Filled 2024-08-04: qty 10

## 2024-08-04 MED ORDER — KETOROLAC TROMETHAMINE 30 MG/ML IJ SOLN
30.0000 mg | Freq: Four times a day (QID) | INTRAMUSCULAR | Status: DC
  Administered 2024-08-04: 30 mg via INTRAVENOUS
  Filled 2024-08-04 (×2): qty 1

## 2024-08-04 MED ORDER — SODIUM CHLORIDE 0.9 % IV SOLN: 2.0000 g | INTRAVENOUS | Status: DC

## 2024-08-04 MED ORDER — ACETAMINOPHEN 500 MG PO TABS
1000.0000 mg | ORAL_TABLET | Freq: Four times a day (QID) | ORAL | Status: DC
  Administered 2024-08-04 (×2): 1000 mg via ORAL
  Filled 2024-08-04 (×2): qty 2

## 2024-08-04 MED ORDER — OXYCODONE HCL 5 MG PO TABS
5.0000 mg | ORAL_TABLET | ORAL | Status: DC | PRN
  Administered 2024-08-04 (×2): 5 mg via ORAL
  Filled 2024-08-04 (×2): qty 1

## 2024-08-04 MED ORDER — PHENYLEPHRINE 80 MCG/ML (10ML) SYRINGE FOR IV PUSH (FOR BLOOD PRESSURE SUPPORT)
PREFILLED_SYRINGE | INTRAVENOUS | Status: DC | PRN
  Administered 2024-08-04: 80 ug via INTRAVENOUS
  Administered 2024-08-04: 160 ug via INTRAVENOUS

## 2024-08-04 MED ORDER — LIDOCAINE HCL (CARDIAC) PF 100 MG/5ML IV SOSY
PREFILLED_SYRINGE | INTRAVENOUS | Status: DC | PRN
  Administered 2024-08-04: 50 mg via INTRAVENOUS

## 2024-08-04 MED ORDER — KETOROLAC TROMETHAMINE 30 MG/ML IJ SOLN
INTRAMUSCULAR | Status: AC
  Filled 2024-08-04: qty 1

## 2024-08-04 MED ORDER — DIPHENHYDRAMINE HCL 50 MG/ML IJ SOLN: 12.5000 mg | Freq: Four times a day (QID) | INTRAMUSCULAR | Status: DC | PRN

## 2024-08-04 MED ORDER — SIMETHICONE 80 MG PO CHEW: 40.0000 mg | CHEWABLE_TABLET | Freq: Four times a day (QID) | ORAL | Status: DC | PRN

## 2024-08-04 MED ORDER — PROCHLORPERAZINE EDISYLATE 10 MG/2ML IJ SOLN: 5.0000 mg | Freq: Four times a day (QID) | INTRAMUSCULAR | Status: DC | PRN

## 2024-08-04 NOTE — Anesthesia Procedure Notes (Signed)
 Procedure Name: Intubation Date/Time: 08/04/2024 9:42 AM  Performed by: Brandy Almarie BROCKS, CRNAPre-anesthesia Checklist: Patient identified, Emergency Drugs available, Suction available and Patient being monitored Patient Re-evaluated:Patient Re-evaluated prior to induction Oxygen Delivery Method: Circle system utilized Preoxygenation: Pre-oxygenation with 100% oxygen Induction Type: IV induction Ventilation: Mask ventilation without difficulty Laryngoscope Size: Mac and 4 Grade View: Grade I Tube type: Oral Tube size: 7.0 mm Number of attempts: 1 Airway Equipment and Method: Stylet Placement Confirmation: ETT inserted through vocal cords under direct vision, positive ETCO2 and breath sounds checked- equal and bilateral Secured at: 23 cm Tube secured with: Tape Dental Injury: Teeth and Oropharynx as per pre-operative assessment

## 2024-08-04 NOTE — Op Note (Signed)
 PATIENT:  Kathryn Obrien  29 y.o. female  PRE-OPERATIVE DIAGNOSIS:  cholecystitis  POST-OPERATIVE DIAGNOSIS:  cholecystitis  PROCEDURE:  Procedures: LAPAROSCOPIC CHOLECYSTECTOMY WITH ICG DYE   SURGEON:  Tifanny Dollens, Herlene Righter, MD   ASSISTANT: none  ANESTHESIA:   local and general  Indications for procedure: Kathryn Obrien is a 29 y.o. female with symptoms of Abdominal pain and Nausea and vomiting consistent with gallbladder disease, Confirmed by ultrasound.  Description of procedure: The patient was brought into the operative suite, placed supine. Anesthesia was administered with endotracheal tube. Patient was strapped in place and foot board was secured. All pressure points were offloaded by foam padding. The patient was prepped and draped in the usual sterile fashion.  A periumbilical incision was made and optical entry was used to enter the abdomen. 2 5 mm trocars were placed on in the right lateral space on in the right subcostal space. A 12mm trocar was placed in the subxiphoid space. Marcaine  was infused to the subxiphoid space and lateral upper right abdomen in the transversus abdominis plane. Next the patient was placed in reverse trendelenberg. The gallbladder appearedfull of stones, dilated, and acutely inflamed. Omentum was adhered to the gallbladder and was taken down with cautery/blunt dissection.  The gallbladder was retracted cephalad and lateral. The peritoneum was reflected off the infundibulum working lateral to medial. The cystic duct and cystic artery were identified and further dissection revealed a critical view. ICG was used to identify the CBD which was away from the dissection area. The cystic duct and cystic artery were doubly clipped and ligated.   The gallbladder was removed off the liver bed with cautery. The Gallbladder was placed in a specimen bag. The gallbladder fossa was irrigated and hemostasis was applied with cautery. The gallbladder was removed via the 12mm  trocar. The fascial defect was closed with interrupted 0 vicryl suture via laparoscopic trans-fascial suture passer. Pneumoperitoneum was removed, all trocar were removed. All incisions were closed with 4-0 monocryl subcuticular stitch. The patient woke from anesthesia and was brought to PACU in stable condition. All counts were correct  Findings: acute calculous cholecystitis  Specimen: gallbladder  Blood loss: 30 ml  Local anesthesia: 30 ml Marcaine   Complications: none  PLAN OF CARE: Discharge to home after PACU  PATIENT DISPOSITION:  PACU - hemodynamically stable.   Herlene Righter [facility], [place]

## 2024-08-04 NOTE — Progress Notes (Signed)
 Pt returned from PACU, 4 lap derma bond sites, no bleeding or swelling, tenderness and gas pain present, ice pack given, pt ambulated from stretcher with 2 assist, pain management continued, pt in bed, laying supine, bed alarm on, safety precautions maintained.

## 2024-08-04 NOTE — Progress Notes (Signed)
 Discharge meds in a secure bag delivered to patient by this RN

## 2024-08-04 NOTE — Transfer of Care (Signed)
 Immediate Anesthesia Transfer of Care Note  Patient: Kathryn Obrien  Procedure(s) Performed: LAPAROSCOPIC CHOLECYSTECTOMY WITH ICG DYE (Abdomen)  Patient Location: PACU  Anesthesia Type:General  Level of Consciousness: awake, alert , and oriented  Airway & Oxygen Therapy: Patient Spontanous Breathing and Patient connected to face mask oxygen  Post-op Assessment: Report given to RN, Post -op Vital signs reviewed and stable, and Patient moving all extremities X 4  Post vital signs: Reviewed and stable  Last Vitals:  Vitals Value Taken Time  BP 140/79   Temp    Pulse 75 08/04/24 11:09  Resp 25 08/04/24 11:09  SpO2 100 % 08/04/24 11:09  Vitals shown include unfiled device data.  Last Pain:  Vitals:   08/04/24 0902  TempSrc:   PainSc: 0-No pain         Complications: No notable events documented.

## 2024-08-04 NOTE — Progress Notes (Signed)
 Pt ready for discharge. A/ox4, Independent and dressed self. AVS printed and reviewed. Follow up appointments confirmed. Letter for work printed and given to patient. Pain addressed. No acute distress. Sites stable. VSS. IV removed. Pt discharged in wheelchair with staff.

## 2024-08-04 NOTE — Anesthesia Preprocedure Evaluation (Signed)
"                                    Anesthesia Evaluation  Patient identified by MRN, date of birth, ID band Patient awake    Reviewed: Allergy & Precautions, H&P , NPO status , Patient's Chart, lab work & pertinent test results  Airway Mallampati: II   Neck ROM: full    Dental   Pulmonary neg pulmonary ROS   breath sounds clear to auscultation       Cardiovascular negative cardio ROS  Rhythm:regular Rate:Normal     Neuro/Psych    GI/Hepatic cholecystitis   Endo/Other    Class 3 obesity  Renal/GU      Musculoskeletal   Abdominal   Peds  Hematology   Anesthesia Other Findings   Reproductive/Obstetrics                              Anesthesia Physical Anesthesia Plan  ASA: 1  Anesthesia Plan: General   Post-op Pain Management:    Induction: Intravenous  PONV Risk Score and Plan: 3 and Ondansetron , Dexamethasone , Midazolam  and Treatment may vary due to age or medical condition  Airway Management Planned: Oral ETT  Additional Equipment:   Intra-op Plan:   Post-operative Plan: Extubation in OR  Informed Consent: I have reviewed the patients History and Physical, chart, labs and discussed the procedure including the risks, benefits and alternatives for the proposed anesthesia with the patient or authorized representative who has indicated his/her understanding and acceptance.     Dental advisory given  Plan Discussed with: CRNA, Anesthesiologist and Surgeon  Anesthesia Plan Comments:         Anesthesia Quick Evaluation  "

## 2024-08-04 NOTE — Progress Notes (Addendum)
 Pt transferred up from ED, alert and oriented x4, no acute distress, stable. CHG bath given to patient and gown. Educated patient on plan of care. Verbalized understanding.   PACU present to transport patient to OR.

## 2024-08-05 ENCOUNTER — Encounter (HOSPITAL_COMMUNITY): Payer: Self-pay | Admitting: General Surgery

## 2024-08-05 LAB — MISC LABCORP TEST (SEND OUT): Labcorp test code: 83935

## 2024-08-05 LAB — SURGICAL PATHOLOGY

## 2024-08-05 NOTE — Discharge Summary (Signed)
 " Physician Discharge Summary  Kathryn Obrien FMW:[id number] DOB: [birth year] DOA: 08/03/2024  PCP: [name], MD  Admit date: 08/03/2024 Discharge date: 08/04/2024 08/05/2024   Recommendations for Outpatient Follow-up:   (include homehealth, outpatient follow-up instructions, specific recommendations for PCP to follow-up on, etc.)   Follow-up Information     [name], [name], PA-C. Go on 09/01/2024.   Specialty: General Surgery Why: At 9:00AM; Call to confirm your follow up visit, Arrive 30 mins prior to scheduled appointment time, Please bring your insurance cards and ID to appointment Contact information: [address] [facility] [place] [address] [phone]                Discharge Diagnoses:  Principal Problem:   Acute calculous cholecystitis   Surgical Procedure: lap chole  Discharge Condition: Good Disposition: Home  Diet recommendation: low fat diet for 2 weeks   Hospital Course:  29 yo female who came into the ED, found to have cholecystitis and taken to the OR. She was discharged home POD 0.  Discharge Instructions  Discharge Instructions     Diet - low sodium heart healthy   Complete by: As directed    Discharge wound care:   Complete by: As directed    Shower normal tomorrow. Glue to stay on for 10-14 days. No bandage needed.   Increase activity slowly   Complete by: As directed       Allergies as of 08/04/2024       Reactions   Bovine (beef) Protein-containing Drug Products Other (See Comments)   Porcine (pork) Protein-containing Drug Products Other (See Comments)        Medication List     TAKE these medications    Cryselle -28 0.3-30 MG-MCG tablet Generic drug: norgestrel -ethinyl estradiol  Take 1 tablet by mouth daily.   ibuprofen  800 MG tablet Commonly known as: ADVIL  Take 1 tablet (800 mg total) by mouth every 8 (eight) hours as needed.   oxyCODONE  5 MG immediate release  tablet Commonly known as: Oxy IR/ROXICODONE  Take 1 tablet (5 mg total) by mouth every 6 (six) hours as needed for severe pain (pain score 7-10).               Discharge Care Instructions  (From admission, onward)           Start     Ordered   08/04/24 0000  Discharge wound care:       Comments: Shower normal tomorrow. Glue to stay on for 10-14 days. No bandage needed.   08/04/24 1119            Follow-up Information     [name], [name], PA-C. Go on 09/01/2024.   Specialty: General Surgery Why: At 9:00AM; Call to confirm your follow up visit, Arrive 30 mins prior to scheduled appointment time, Please bring your insurance cards and ID to appointment Contact information: [address] [name] [address] [phone]                  The results of significant diagnostics from this hospitalization (including imaging, microbiology, ancillary and laboratory) are listed below for reference.    Significant Diagnostic Studies: US  Abdomen Limited RUQ (LIVER/GB) Result Date: 08/03/2024 EXAM: Right Upper Quadrant Abdominal Ultrasound 08/03/2024 07:51:19 PM TECHNIQUE: Real-time ultrasonography of the right upper quadrant of the abdomen was performed. COMPARISON: None available. CLINICAL HISTORY: RUQ abdominal pain. FINDINGS: LIVER: Normal echogenicity. No intrahepatic biliary ductal  dilatation. No evidence of mass. Hepatopetal flow in the portal vein. BILIARY SYSTEM: The gallbladder is distended and contains multiple mobile gallstones measuring up to 2 cm as well as a 17 mm gallstone impacted within the gallbladder neck. There is gallbladder wall thickening with the gallbladder wall measuring up to 4 mm in thickness as well as trace pericholecystic fluid identified. Altogether, the findings were in keeping with acute cholecystitis. The sonographic Beverley sign is reportedly negative, equivocal given the appearance of the  gallbladder described above. Common bile duct measures 3 mm in proximal diameter. RIGHT KIDNEY: Limited images of the right kidney are unremarkable. No hydronephrosis. PANCREAS: Pancreas is obscured by overlying bowel gas. OTHER: No right upper quadrant ascites. IMPRESSION: 1. Acute calculus cholecystitis with 17 mm gallstone impacted in the gallbladder neck. Electronically signed by: Dorethia Molt MD 08/03/2024 07:58 PM EST RP Workstation: [id number]   CT ABDOMEN PELVIS W CONTRAST Result Date: 08/03/2024 EXAM: CT ABDOMEN AND PELVIS WITH CONTRAST 08/03/2024 05:27:21 PM TECHNIQUE: CT of the abdomen and pelvis was performed with the administration of 75 mL of iohexol  (OMNIPAQUE ) 300 MG/ML solution. Multiplanar reformatted images are provided for review. Automated exposure control, iterative reconstruction, and/or weight-based adjustment of the mA/kV was utilized to reduce the radiation dose to as low as reasonably achievable. COMPARISON: None available. CLINICAL HISTORY: Acute abdominal pain for 2 days. FINDINGS: LOWER CHEST: Lung bases show minimal basilar atelectasis. LIVER: The liver is unremarkable. GALLBLADDER AND BILE DUCTS: The gallbladder is well distended with multiple gallstones. Some wall thickening is noted which is suspicious for cholecystitis. Ultrasound may be helpful for further evaluation. No biliary ductal dilatation. SPLEEN: No acute abnormality. PANCREAS: No acute abnormality. ADRENAL GLANDS: No acute abnormality. KIDNEYS, URETERS AND BLADDER: No renal calculi or obstructive changes are seen. No hydronephrosis. No perinephric or periureteral stranding. The bladder is decompressed. GI AND BOWEL: Stomach demonstrates no acute abnormality. No obstructive or inflammatory changes of the colon are noted. There is no bowel obstruction. PERITONEUM AND RETROPERITONEUM: No ascites. No free air. VASCULATURE: Aorta is normal in caliber. LYMPH NODES: No lymphadenopathy. REPRODUCTIVE ORGANS: No acute  abnormality. BONES AND SOFT TISSUES: No acute osseous abnormality. No focal soft tissue abnormality. IMPRESSION: 1. Gallbladder with multiple gallstones and wall thickening, suspicious for cholecystitis. Ultrasound may be helpful for further evaluation. Electronically signed by: Oneil Devonshire MD 08/03/2024 05:33 PM EST RP Workstation: [id number]    Labs: Basic Metabolic Panel: Recent Labs  Lab 08/03/24 1302 08/04/24 0235  NA 137 134*  K 3.8 3.5  CL 103 101  CO2 23 22  GLUCOSE 107* 106*  BUN 8 9  CREATININE 0.97 0.92  CALCIUM 9.8 9.9   Liver Function Tests: Recent Labs  Lab 08/03/24 1302 08/04/24 0235  AST 17 17  ALT 22 20  ALKPHOS 97 102  BILITOT 0.5 0.4  PROT 7.5 7.1  ALBUMIN 4.2 3.9    CBC: Recent Labs  Lab 08/03/24 1302 08/04/24 0235  WBC 12.0* 10.6*  HGB 13.3 12.6  HCT 40.8 37.9  MCV 82.9 83.3  PLT 369 318    CBG: No results for input(s): GLUCAP in the last 168 hours.  Principal Problem:   Acute calculous cholecystitis   Time coordinating discharge: 15 min  "

## 2024-08-06 NOTE — Anesthesia Postprocedure Evaluation (Signed)
"   Anesthesia Post Note  Patient: Markeshia Giebel  Procedure(s) Performed: LAPAROSCOPIC CHOLECYSTECTOMY WITH ICG DYE (Abdomen)     Patient location during evaluation: PACU Anesthesia Type: General Level of consciousness: awake and alert Pain management: pain level controlled Vital Signs Assessment: post-procedure vital signs reviewed and stable Respiratory status: spontaneous breathing, nonlabored ventilation, respiratory function stable and patient connected to nasal cannula oxygen Cardiovascular status: blood pressure returned to baseline and stable Postop Assessment: no apparent nausea or vomiting Anesthetic complications: no   No notable events documented.  Last Vitals:  Vitals:   08/04/24 1200 08/04/24 1212  BP: 134/88 128/77  Pulse: 84 69  Resp: 16 18  Temp: 36.7 C 36.4 C  SpO2: 98% 100%    Last Pain:  Vitals:   08/04/24 1539  TempSrc:   PainSc: 6                  Dwyane Dupree S      "

## 2024-08-07 ENCOUNTER — Other Ambulatory Visit (HOSPITAL_BASED_OUTPATIENT_CLINIC_OR_DEPARTMENT_OTHER): Payer: Self-pay

## 2024-08-07 ENCOUNTER — Other Ambulatory Visit (HOSPITAL_COMMUNITY): Payer: Self-pay
